# Patient Record
Sex: Female | Born: 1960 | Hispanic: No | Marital: Married | Smoking: Former smoker
Health system: Southern US, Community
[De-identification: ages and names within clinical notes are randomized; demographics above are authoritative.]

## PROBLEM LIST (undated history)

## (undated) DIAGNOSIS — T7840XA Allergy, unspecified, initial encounter: Secondary | ICD-10-CM

## (undated) DIAGNOSIS — D649 Anemia, unspecified: Secondary | ICD-10-CM

## (undated) DIAGNOSIS — K219 Gastro-esophageal reflux disease without esophagitis: Secondary | ICD-10-CM

## (undated) DIAGNOSIS — I10 Essential (primary) hypertension: Secondary | ICD-10-CM

## (undated) DIAGNOSIS — M1990 Unspecified osteoarthritis, unspecified site: Secondary | ICD-10-CM

## (undated) HISTORY — DX: Gastro-esophageal reflux disease without esophagitis: K21.9

## (undated) HISTORY — DX: Unspecified osteoarthritis, unspecified site: M19.90

## (undated) HISTORY — DX: Allergy, unspecified, initial encounter: T78.40XA

## (undated) HISTORY — PX: TUBAL LIGATION: SHX77

## (undated) HISTORY — DX: Essential (primary) hypertension: I10

## (undated) HISTORY — DX: Anemia, unspecified: D64.9

## (undated) HISTORY — PX: CHOLECYSTECTOMY: SHX55

---

## 2011-05-18 ENCOUNTER — Ambulatory Visit (INDEPENDENT_AMBULATORY_CARE_PROVIDER_SITE_OTHER): Payer: 59 | Admitting: Family Medicine

## 2011-05-18 VITALS — BP 148/84 | HR 68 | Temp 98.3°F | Resp 18 | Ht 64.0 in | Wt 261.2 lb

## 2011-05-18 DIAGNOSIS — K047 Periapical abscess without sinus: Secondary | ICD-10-CM

## 2011-05-18 DIAGNOSIS — E669 Obesity, unspecified: Secondary | ICD-10-CM

## 2011-05-18 DIAGNOSIS — K089 Disorder of teeth and supporting structures, unspecified: Secondary | ICD-10-CM

## 2011-05-18 DIAGNOSIS — I10 Essential (primary) hypertension: Secondary | ICD-10-CM

## 2011-05-18 DIAGNOSIS — K0889 Other specified disorders of teeth and supporting structures: Secondary | ICD-10-CM

## 2011-05-18 DIAGNOSIS — K029 Dental caries, unspecified: Secondary | ICD-10-CM

## 2011-05-18 MED ORDER — HYDROCODONE-ACETAMINOPHEN 5-500 MG PO TABS: 1.0000 | ORAL_TABLET | Freq: Three times a day (TID) | ORAL | Status: AC | PRN

## 2011-05-18 MED ORDER — CLINDAMYCIN HCL 300 MG PO CAPS: 300.0000 mg | ORAL_CAPSULE | Freq: Four times a day (QID) | ORAL | Status: AC

## 2011-05-18 NOTE — Progress Notes (Signed)
  Patient Name: Kim Lawson Date of Birth: 17-Dec-1960 Medical Record Number: [id number] Gender: female Date of Encounter: 05/18/2011  History of Present Illness:  Kim Lawson is a 51 y.o. very pleasant female patient who presents with the following:  Left toothache for about 3 days.  Has swollen and hurts a lot.  She has noted a temp to about 100.  This tooth has been a problem in the past- has broken off and needs repair.  She does not yet have a dentist but plans to call - she just got dental insurance.   No other symptoms to suggest a cold or illness.  She does note tender lymph nodes on the left as well.   She has a history of HTN and her PCP is Dr. Drue Stager in [place] but they are out today.   There is no problem list on file for this patient.  No past medical history on file. No past surgical history on file. History  Substance Use Topics  . Smoking status: Former Games developer  . Smokeless tobacco: Never Used  . Alcohol Use: No   No family history on file. Allergies  Allergen Reactions  . Penicillins Rash  . Septra (Bactrim) Rash    Medication list has been reviewed and updated.  Review of Systems: As per HPI- otherwise negative.   Physical Examination: Filed Vitals:   05/18/11 1353  BP: 148/84  Pulse: 68  Temp: 98.3 F (36.8 C)  TempSrc: Oral  Resp: 18  Height: 5\' 4"  (1.626 m)  Weight: 261 lb 3.2 oz (118.48 kg)    Body mass index is 44.84 kg/(m^2).   GEN: WDWN, NAD, Non-toxic, Alert & Oriented x 3, obese HEENT: Atraumatic, Normocephalic.  Ears and Nose: No external deformity.  TM wnl bilaterally, oropharynx wnl EXTR: No clubbing/cyanosis/edema NEURO: Normal gait.  PSYCH: Normally interactive. Conversant. Not depressed or anxious appearing.  Calm demeanor.  Left mandibular tooth # 21 or 22 is broken off at the base- surrounding area is swollen and tender, no fluctuance.  Left anterior chain nodes are slightly enlarged  Assessment and Plan: 1.  Toothache  HYDROcodone-acetaminophen (VICODIN) 5-500 MG per tablet  2. Dental infection  clindamycin (CLEOCIN) 300 MG capsule   Treat dental infection and pain as above. Let us know if we can help in any other way- she plans to get in touch with a dentist asap.

## 2012-03-28 ENCOUNTER — Ambulatory Visit (INDEPENDENT_AMBULATORY_CARE_PROVIDER_SITE_OTHER): Payer: 59 | Admitting: Internal Medicine

## 2012-03-28 VITALS — BP 132/88 | HR 102 | Temp 98.3°F | Resp 20 | Ht 65.0 in | Wt 248.0 lb

## 2012-03-28 DIAGNOSIS — L4050 Arthropathic psoriasis, unspecified: Secondary | ICD-10-CM | POA: Insufficient documentation

## 2012-03-28 DIAGNOSIS — R509 Fever, unspecified: Secondary | ICD-10-CM

## 2012-03-28 DIAGNOSIS — L409 Psoriasis, unspecified: Secondary | ICD-10-CM | POA: Insufficient documentation

## 2012-03-28 DIAGNOSIS — I10 Essential (primary) hypertension: Secondary | ICD-10-CM | POA: Insufficient documentation

## 2012-03-28 DIAGNOSIS — R1084 Generalized abdominal pain: Secondary | ICD-10-CM

## 2012-03-28 DIAGNOSIS — N3941 Urge incontinence: Secondary | ICD-10-CM | POA: Insufficient documentation

## 2012-03-28 DIAGNOSIS — R197 Diarrhea, unspecified: Secondary | ICD-10-CM

## 2012-03-28 LAB — POCT CBC
Granulocyte percent: 63.3 %G (ref 37–80)
HCT, POC: 45.1 % (ref 37.7–47.9)
Lymph, poc: 2.9 (ref 0.6–3.4)
MCHC: 31.9 g/dL (ref 31.8–35.4)
MCV: 94.2 fL (ref 80–97)
POC LYMPH PERCENT: 30.8 %L (ref 10–50)
RDW, POC: 15 %

## 2012-03-28 LAB — IFOBT (OCCULT BLOOD): IFOBT: NEGATIVE

## 2012-03-28 MED ORDER — DICYCLOMINE HCL 10 MG PO CAPS: 10.0000 mg | ORAL_CAPSULE | Freq: Three times a day (TID) | ORAL | Status: DC

## 2012-03-28 NOTE — Patient Instructions (Addendum)
Call Dr Rhetta Mura in the am for an appt. Try to eat the BRAT diet and drink to decrease dehydration. Collect stool samples.

## 2012-03-28 NOTE — Progress Notes (Signed)
[address] [place] [address]   Phone [phone]  Subjective:    Patient ID: Kim Lawson, female    DOB: 06/28/1960, 52 y.o.   MRN: [id number]  HPI  Pt presents to clinic with bloody diarrhea.  She started 8 days ago with a GI illness with diarrhea and vomiting and nausea that got better in a few days but on Thursday had got worse again.  She went to PCP who thought it was viral and suggested she use Imodium.  She was ok with that until today when she started to pass blood clots into the toilet.  She has had for the past 8 days - watery diarrhea at least 10 times per day, and today 4 stools with blood clots that sink to the bottom of the toilet - they have no stool in them.  She has not been able to eat in a week due to increase abd cramping after eating and then immediate diarrhea.  She is drinking but not much due also to abd cramping and instant diarrhea.  She has had fevers all week in to low 100s, Tmax 102.  She feels terrible, weak and dizzy.  She has not been on recent abx or travel. Enbrel is her newest medicine and it was started 3 months ago.  She has not been around anyone with similar symptoms.  She wore an adult diaper tonight due to the concern she would not be able to make it to the bathroom in time because when she gets the abd cramping the diarrhea is instant and explosive.  She has had a 10# weight loss. She has a GI MD Rhetta Mura) in [place] that she thinks that she can see tomorrow.   Review of Systems  Constitutional: Positive for fever and chills.  Gastrointestinal: Positive for nausea (resolved X 4 days.), vomiting (resolved x 4days), abdominal pain, diarrhea and blood in stool.  Neurological: Positive for dizziness.       Objective:   Physical Exam  Vitals reviewed. Constitutional: She is oriented to person, place, and time. She appears well-developed and well-nourished.  Pt is pale and appears that she does not feel good.  HENT:  Head: Normocephalic and  atraumatic.  Right Ear: External ear normal.  Left Ear: External ear normal.  Cardiovascular: Normal rate, regular rhythm and normal heart sounds.   Pulmonary/Chest: Effort normal and breath sounds normal.  Abdominal: Soft. She exhibits no mass. There is tenderness. There is no rebound and no guarding.  Genitourinary: Rectal exam shows no external hemorrhoid, no internal hemorrhoid (not palpable), no fissure, no tenderness and anal tone normal. Guaiac negative stool (blood on glove after exam).  Musculoskeletal: Normal range of motion.  Neurological: She is alert and oriented to person, place, and time.  Skin: Skin is warm and dry.  Psychiatric: She has a normal mood and affect. Her behavior is normal. Judgment and thought content normal.    Results for orders placed in visit on 03/28/12  IFOBT (OCCULT BLOOD)      Result Value Range   IFOBT Negative    POCT CBC      Result Value Range   WBC 9.5  4.6 - 10.2 K/uL   Lymph, poc 2.9  0.6 - 3.4   POC LYMPH PERCENT 30.8  10 - 50 %L   MID (cbc) 0.6  0 - 0.9   POC MID % 5.9  0 - 12 %M   POC Granulocyte 6.0  2 - 6.9  Granulocyte percent 63.3  37 - 80 %G   RBC 4.79  4.04 - 5.48 M/uL   Hemoglobin 14.4  12.2 - 16.2 g/dL   HCT, POC 16.1  09.6 - 47.9 %   MCV 94.2  80 - 97 fL   MCH, POC 30.1  27 - 31.2 pg   MCHC 31.9  31.8 - 35.4 g/dL   RDW, POC 04.5     Platelet Count, POC 296  142 - 424 K/uL   MPV 9.6  0 - 99.8 fL       Assessment & Plan:   1. Bloody diarrhea  IFOBT POC (occult bld, rslt in office)   IFOBT POC (occult bld, rslt in office)   Giardia/cryptosporidium (EIA)   Ova and parasite examination   Fecal lactoferrin   Stool culture   Clostridium difficile toxin   Sedimentation Rate   Comprehensive metabolic panel   CANCELED: POCT SEDIMENTATION RATE  2. Fever, unspecified  POCT CBC   POCT CBC  3. Generalized abdominal cramping  dicyclomine (BENTYL) 10 MG capsule   dicyclomine (BENTYL) 10 MG capsule   Pt to call her GI in  the am for an appt.  The most likely caused of her bloody diarrhea is an internal hemorrhoid resulting from 8 days of diarrhea.  Her stool is probably partially still liquid due to no solid food in 8 days.  Pt in not orthostatic even on BP meds but she is dehydrated due to her elevated pulse and she is going to try and increase her fluid intake and hopefully the bentyl will allow her to drink without having cramps and instant diarrhea.  She should watch her motrin intake.  She will collect her stool cultures and take them to her appt with her GI if it is tomorrow, if she has trouble getting an appt she will let me know.   D/w Dr Merla Riches. I have reviewed and agree with documentation. Robert P. Merla Riches, M.D.

## 2012-03-29 ENCOUNTER — Telehealth: Payer: Self-pay | Admitting: Physician Assistant

## 2012-03-29 LAB — COMPREHENSIVE METABOLIC PANEL
ALT: 60 U/L — ABNORMAL HIGH (ref 0–35)
AST: 55 U/L — ABNORMAL HIGH (ref 0–37)
Albumin: 4 g/dL (ref 3.5–5.2)
Alkaline Phosphatase: 71 U/L (ref 39–117)
BUN: 9 mg/dL (ref 6–23)
Calcium: 9.6 mg/dL (ref 8.4–10.5)
Chloride: 103 mEq/L (ref 96–112)
Creat: 0.8 mg/dL (ref 0.50–1.10)
Total Bilirubin: 0.5 mg/dL (ref 0.3–1.2)
Total Protein: 7 g/dL (ref 6.0–8.3)

## 2012-03-29 LAB — SEDIMENTATION RATE: Sed Rate: 10 mm/hr (ref 0–22)

## 2012-03-29 NOTE — Telephone Encounter (Signed)
I was just calling to check on patients status and make sure that she got into GI today.  Her SED rate was normal. Her liver enzymes are slightly elevated.  They should be rechecked by someone at some point.

## 2012-03-31 LAB — FECAL LACTOFERRIN, QUANT: Lactoferrin: POSITIVE

## 2012-04-01 LAB — GIARDIA/CRYPTOSPORIDIUM (EIA): Cryptosporidium Screen (EIA): NEGATIVE

## 2012-04-02 LAB — STOOL CULTURE

## 2012-04-04 LAB — OVA AND PARASITE EXAMINATION: OP: NONE SEEN

## 2012-04-18 LAB — CLOSTRIDIUM DIFFICILE CULTURE-FECAL

## 2012-04-18 NOTE — Telephone Encounter (Signed)
C Diff results finally returned normal.  I called and LMOM to check her status and to have her call back if there were any further concerns

## 2014-06-10 ENCOUNTER — Ambulatory Visit (INDEPENDENT_AMBULATORY_CARE_PROVIDER_SITE_OTHER): Payer: Managed Care, Other (non HMO) | Admitting: Family Medicine

## 2014-06-10 VITALS — BP 148/68 | HR 90 | Temp 98.3°F | Resp 20 | Ht 64.0 in | Wt 259.2 lb

## 2014-06-10 DIAGNOSIS — K088 Other specified disorders of teeth and supporting structures: Secondary | ICD-10-CM | POA: Diagnosis not present

## 2014-06-10 DIAGNOSIS — H9201 Otalgia, right ear: Secondary | ICD-10-CM

## 2014-06-10 DIAGNOSIS — K120 Recurrent oral aphthae: Secondary | ICD-10-CM | POA: Diagnosis not present

## 2014-06-10 DIAGNOSIS — R6884 Jaw pain: Secondary | ICD-10-CM | POA: Diagnosis not present

## 2014-06-10 DIAGNOSIS — K0889 Other specified disorders of teeth and supporting structures: Secondary | ICD-10-CM

## 2014-06-10 DIAGNOSIS — Z79899 Other long term (current) drug therapy: Secondary | ICD-10-CM

## 2014-06-10 MED ORDER — CLINDAMYCIN HCL 300 MG PO CAPS: 300.0000 mg | ORAL_CAPSULE | Freq: Three times a day (TID) | ORAL | Status: DC

## 2014-06-10 MED ORDER — VALACYCLOVIR HCL 1 G PO TABS: 1000.0000 mg | ORAL_TABLET | Freq: Three times a day (TID) | ORAL | Status: DC

## 2014-06-10 MED ORDER — TRIAMCINOLONE ACETONIDE 0.1 % MT PSTE: 1.0000 "application " | PASTE | Freq: Two times a day (BID) | OROMUCOSAL | Status: AC

## 2014-06-10 MED ORDER — TRAMADOL HCL 50 MG PO TABS: 50.0000 mg | ORAL_TABLET | Freq: Four times a day (QID) | ORAL | Status: DC | PRN

## 2014-06-10 NOTE — Progress Notes (Signed)
Subjective:    Patient ID: Kim Lawson, female    DOB: August 07, 1960, 54 y.o.   MRN: [id number]  HPI Kim Lawson is a 54 y.o. female  C/o sore on side of tongue that started with some on front of tongue about 8 day ago. Front of tongue had blisters that resolved after about 3 days, but lesion on side of tongue has persisted.  Feels more painful, sore to eat and drink. Has ad mouth sores about 2-3 times per year, but usually resolve on own.   Tx: oragel, ibuprofen.   Now with pain into R ear and R jaw since 2 nights ago. Feels swollen under R jaw. Hearing normally out of ear, no tinnitus.  Sharp pain into R ear. No rash on face now or initially.   Takes Enbrel for psoriatic arthritis/psoriasis.    Patient Active Problem List   Diagnosis Date Noted  . Psoriatic arthritis 03/28/2012  . Psoriasis 03/28/2012  . Urge incontinence 03/28/2012  . HTN (hypertension) 03/28/2012  . Hypertension 05/18/2011  . Obesity 05/18/2011   Past Medical History  Diagnosis Date  . Allergy   . Anemia   . Arthritis   . GERD (gastroesophageal reflux disease)   . Hypertension    Past Surgical History  Procedure Laterality Date  . Cholecystectomy    . Tubal ligation     Allergies  Allergen Reactions  . Penicillins Rash  . Septra [Bactrim] Rash  . Sulfa Antibiotics Rash  . Latex   . Norco [Hydrocodone-Acetaminophen] Rash   Prior to Admission medications   Medication Sig Start Date End Date Taking? Authorizing Provider  aspirin 325 MG EC tablet Take 325 mg by mouth daily.   Yes Historical Provider, MD  dicyclomine (BENTYL) 10 MG capsule Take 1-2 capsules (10-20 mg total) by mouth 4 (four) times daily -  before meals and at bedtime. 03/28/12  Yes Morrell RiddleSarah L Weber, PA-C  etanercept (ENBREL) 50 MG/ML injection Inject 50 mg into the skin. Bi-weekly   Yes Historical Provider, MD  lisinopril-hydrochlorothiazide (PRINZIDE,ZESTORETIC) 20-25 MG per tablet Take 1 tablet by mouth daily.   Yes Historical  Provider, MD  metoprolol succinate (TOPROL-XL) 50 MG 24 hr tablet Take 50 mg by mouth daily. Take with or immediately following a meal.   Yes Historical Provider, MD  solifenacin (VESICARE) 10 MG tablet Take 5 mg by mouth daily.   Yes Historical Provider, MD   History   Social History  . Marital Status: Married    Spouse Name: N/A  . Number of Children: N/A  . Years of Education: N/A   Occupational History  . RN    Social History Main Topics  . Smoking status: Former Games developermoker  . Smokeless tobacco: Never Used  . Alcohol Use: No  . Drug Use: No  . Sexual Activity: Not on file   Other Topics Concern  . Not on file   Social History Narrative       Review of Systems  Constitutional: Positive for chills. Negative for fever.  HENT: Positive for congestion, ear pain and sore throat (r side. ). Negative for ear discharge, facial swelling and trouble swallowing.   Respiratory: Negative for cough and shortness of breath.   Skin: Negative for rash.  Neurological: Negative for dizziness, seizures, syncope, speech difficulty, weakness, light-headedness and headaches.       Objective:   Physical Exam  Constitutional: She is oriented to person, place, and time. She appears well-developed and well-nourished. No distress.  HENT:  Head: Normocephalic and atraumatic.    Right Ear: Hearing, tympanic membrane, external ear and ear canal normal. Tympanic membrane is not erythematous, not retracted and not bulging.  Left Ear: Hearing, tympanic membrane, external ear and ear canal normal.  Nose: Nose normal. Right sinus exhibits no maxillary sinus tenderness and no frontal sinus tenderness. Left sinus exhibits no maxillary sinus tenderness and no frontal sinus tenderness.  Mouth/Throat: Uvula is midline, oropharynx is clear and moist and mucous membranes are normal. Oral lesions present. No oropharyngeal exudate.    Eyes: Conjunctivae and EOM are normal. Pupils are equal, round, and  reactive to light.  Neck: Trachea normal. Neck supple.    Cardiovascular: Normal rate, regular rhythm, normal heart sounds and intact distal pulses.   No murmur heard. Pulmonary/Chest: Effort normal and breath sounds normal. No respiratory distress. She has no wheezes. She has no rhonchi.  Lymphadenopathy:    She has no cervical adenopathy.  Neurological: She is alert and oriented to person, place, and time.  Skin: Skin is warm and dry. No rash noted.  Psychiatric: She has a normal mood and affect. Her behavior is normal.  Vitals reviewed.  Filed Vitals:   06/10/14 1228  BP: 148/68  Pulse: 90  Temp: 98.3 F (36.8 C)  TempSrc: Oral  Resp: 20  Height:  (1.626 m)  Weight: 259 lb 4 oz (117.595 kg)  SpO2: 97%          Assessment & Plan:   Kim Lawson is a 54 y.o. 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 This may be causing pain radiation to jaw/ear, but ddx also includes early zoster. No rash seen, including in ear canal, bu with underlying immune suppression with use of Enbrel, possible increased risk of Zoster.   -valtrex 1gram TID to cover for HSV/zoster.   -Clindamycin for possible odontogenic infection with PCN allergy. SED, Cdiff risks/sx's advised.   -plan for her to call dentist for eval in 2 days.   -kenalog in orabase to apthous ulcer.   -tramadol if needed for pain - SED.   -rtc precautions.   Meds ordered this encounter  Medications  . triamcinolone (KENALOG) 0.1 % paste      Sig: Use as directed 1 application in the mouth or throat 2 (two) times daily. To ulcer on tongue only.    Dispense:  5 g    Refill:  0  . valACYclovir (VALTREX) 1000 MG tablet    Sig: Take 1 tablet (1,000 mg total) by mouth 3 (three) times daily.    Dispense:  21 tablet    Refill:  0  . clindamycin (CLEOCIN) 300 MG capsule    Sig: Take 1 capsule (300 mg total) by mouth 3 (three) times daily.    Dispense:  30 capsule    Refill:  0  . traMADol (ULTRAM) 50 MG tablet    Sig: Take 1 tablet (50 mg total) by mouth every 6 (six) hours as needed.    Dispense:  30 tablet    Refill:  0   Patient Instructions  Call you dentist tomorrow as some of pain may be coming from the lower right tooth - this can radiate to jaw.  In case there is an infection around that tooth, I started clindamycin.   I also prescribed valtrex in case the pain in your face is due to the shingles virus or cold sore virus, as with your usual medication - your immune system may be susceptible.   If pain is not improving in next few days, or worsening sooner (including any fever, or rash) - return here or emergency room.   Return to the clinic or go to the nearest emergency room if any of your symptoms worsen or new symptoms occur.

## 2014-06-10 NOTE — Patient Instructions (Signed)
Call you dentist tomorrow as some of pain may be coming from the lower right tooth - this can radiate to jaw.  In case there is an infection around that tooth, I started clindamycin.   I also prescribed valtrex in case the pain in your face is due to the shingles virus or cold sore virus, as with your usual medication - your immune system may be susceptible.   If pain is not improving in next few days, or worsening sooner (including any fever, or rash) - return here or emergency room.   Return to the clinic or go to the nearest emergency room if any of your symptoms worsen or new symptoms occur.

## 2014-08-15 ENCOUNTER — Ambulatory Visit (INDEPENDENT_AMBULATORY_CARE_PROVIDER_SITE_OTHER): Payer: Managed Care, Other (non HMO) | Admitting: Family Medicine

## 2014-08-15 ENCOUNTER — Ambulatory Visit (INDEPENDENT_AMBULATORY_CARE_PROVIDER_SITE_OTHER): Payer: Managed Care, Other (non HMO)

## 2014-08-15 VITALS — BP 122/78 | HR 92 | Temp 98.1°F | Resp 18 | Ht 64.5 in | Wt 259.0 lb

## 2014-08-15 DIAGNOSIS — S90122A Contusion of left lesser toe(s) without damage to nail, initial encounter: Secondary | ICD-10-CM

## 2014-08-15 MED ORDER — TRAMADOL HCL 50 MG PO TABS: 50.0000 mg | ORAL_TABLET | Freq: Four times a day (QID) | ORAL | Status: DC | PRN

## 2014-08-15 NOTE — Progress Notes (Signed)
[facility] [address], [place] [id number] [phone]  Date:  08/15/2014   Name:  Kim ApleyLaura Lawson   DOB:  24-Sep-1960   MRN:  [id number]  PCP:  No primary care provider on file.    Chief Complaint: Foot Injury   History of Present Illness:  Kim ApleyLaura Lawson is a 54 y.o. very pleasant female patient who presents with the following:  She hit her left foot against the lawnmower a few days ago- hurt her pinky toe.  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  She was on her feet all night last night- this seems to have exeraberated   Patient Active Problem List   Diagnosis Date Noted  . Psoriatic arthritis 03/28/2012  . Psoriasis 03/28/2012  . Urge incontinence 03/28/2012  . HTN (hypertension) 03/28/2012  . Hypertension 05/18/2011  . Obesity 05/18/2011    Past Medical History  Diagnosis Date  . Allergy   . Anemia   . Arthritis   . GERD (gastroesophageal reflux disease)   . Hypertension     Past Surgical History  Procedure Laterality Date  . Cholecystectomy    . Tubal ligation      History  Substance Use Topics  . Smoking status: Former Games developermoker  . Smokeless tobacco: Never Used  . Alcohol Use: No    Family History  Problem Relation Age of Onset  . Cancer Mother   . Heart disease Mother   . Heart disease Father   . Cancer Father   . Epilepsy Brother   . Asthma Son   . Heart attack Maternal Grandmother   . Cancer Maternal Grandfather   . Stroke Paternal Grandmother   . Cancer Paternal Grandfather   . Asthma Son     Allergies  Allergen Reactions  . Penicillins Rash  . Septra [Bactrim] Rash  . Sulfa Antibiotics Rash  . Latex   . Norco [Hydrocodone-Acetaminophen] Rash    Medication list has been reviewed and updated.  Current Outpatient Prescriptions on File Prior to Visit  Medication Sig Dispense Refill  . aspirin 325 MG EC tablet Take 325 mg by mouth daily.    Marland Kitchen. etanercept (ENBREL) 50 MG/ML  injection Inject 50 mg into the skin. Bi-weekly    . lisinopril-hydrochlorothiazide (PRINZIDE,ZESTORETIC) 20-25 MG per tablet Take 1 tablet by mouth daily.    . metoprolol succinate (TOPROL-XL) 50 MG 24 hr tablet Take 50 mg by mouth daily. Take with or immediately following a meal.    . triamcinolone (KENALOG) 0.1 % paste Use as directed 1 application in the mouth or throat 2 (two) times daily. To ulcer on tongue only. 5 g 0  . clindamycin (CLEOCIN) 300 MG capsule Take 1 capsule (300 mg total) by mouth 3 (three) times daily. (Patient not taking: Reported on 08/15/2014) 30 capsule 0  . dicyclomine (BENTYL) 10 MG capsule Take 1-2 capsules (10-20 mg total) by mouth 4 (four) times daily -  before meals and at bedtime. (Patient not taking: Reported on 08/15/2014) 40 capsule 0  . solifenacin (VESICARE) 10 MG tablet Take 5 mg by mouth daily.    . traMADol (ULTRAM) 50 MG tablet Take 1 tablet (50 mg total) by mouth every 6 (six) hours as needed. (Patient not taking: Reported on 08/15/2014) 30 tablet 0  . valACYclovir (VALTREX) 1000 MG tablet Take 1 tablet (1,000 mg total) by mouth 3 (three) times daily. (Patient not taking: Reported on 08/15/2014)  21 tablet 0   No current facility-administered medications on file prior to visit.    Review of Systems:  As per HPI- otherwise negative.   Physical Examination: Filed Vitals:   08/15/14 1339  BP: 122/78  Pulse: 92  Temp: 98.1 F (36.7 C)  Resp: 18   Filed Vitals:   08/15/14 1339  Height: 5' 4.5" (1.638 m)  Weight: 259 lb (117.482 kg)   Body mass index is 43.79 kg/(m^2). Ideal Body Weight: Weight in (lb) to have BMI = 25: 147.6  GEN: WDWN, NAD, Non-toxic, A & O x 3 HEENT: Atraumatic, Normocephalic. Neck supple. No masses, No LAD. Ears and Nose: No external deformity. CV: RRR, No M/G/R. No JVD. No thrill. No extra heart sounds. PULM: CTA B, no wheezes, crackles, rhonchi. No retractions. No resp. distress. No accessory muscle use. EXTR: No  c/c/e NEURO Normal gait.  PSYCH: Normally interactive. Conversant. Not depressed or anxious appearing.  Calm demeanor.  Left foot: tenderness, bruise at the base of the small toe.  Tenderness over the small toe and at the distal 4th MT as well. The rest of her foot is non- tender, ankle negative Foot NV intact   [facility] reading (PRIMARY) by  Dr. Patsy Lager. Left foot: non- displaced fracture at proximal phalanx of 5th toe, proximal aspect   LEFT FOOT - COMPLETE 3+ VIEW  COMPARISON: None  FINDINGS: The bones of the fifth toe are osteopenic. No acute fracture nor dislocation is observed. There is a radiopaque foreign body that projects in the soft tissues over the medial aspect of the distal portion of the toe.  The other phalanges are intact. The metatarsals are intact. The bones of the hindfoot exhibit no acute abnormalities. There is a large plantar calcaneal spur.  IMPRESSION: There is no acute bony abnormality of the fifth toe. There is a approximately 1 mm radiodense foreign body in the soft tissues of the medial aspect of the fifth toe.  Toe buddy taped to 4th toe and placed in a post- op shoe; this felt good to her Pt endorses that she can take tramadol wihtout ill effect  Assessment and Plan: Contusion, toe, left, initial encounter - Plan: DG Foot Complete Left, traMADol (ULTRAM) 50 MG tablet  Foot contusion, possible toe fracture.  Buddy taped and placed in post- op shoe.  Refilled tramadol for her to use as needed for pain Note to be OOW for 2 days Called when OR report received as above- left detailed message about possible metallic FB.  This is likely old but will check in with her again later  Signed Abbe Amsterdam, MD

## 2014-08-15 NOTE — Patient Instructions (Signed)
Keep your foot elevated when you can, and do not go to work for the next couple of days Use the tramadol as needed for pain Let me know if you do not have improved pain soon! Wear the stiff shoe and buddy tape to the 4th toe as needed

## 2014-08-16 ENCOUNTER — Telehealth: Payer: Self-pay | Admitting: Family Medicine

## 2014-08-18 NOTE — Telephone Encounter (Signed)
Called again- no answer.  LMOM that I will send her a message of her x-ray report- if any questions or persistent pain please call

## 2015-12-14 IMAGING — CR DG FOOT COMPLETE 3+V*L*
3 series · 3 of 3 positions shown · non-contrast
Comparison: None

CLINICAL DATA: Low are mobile or injury of the left fifth toe with
increasing pain and visible bruising

EXAM:
LEFT FOOT - COMPLETE 3+ VIEW

[AP]
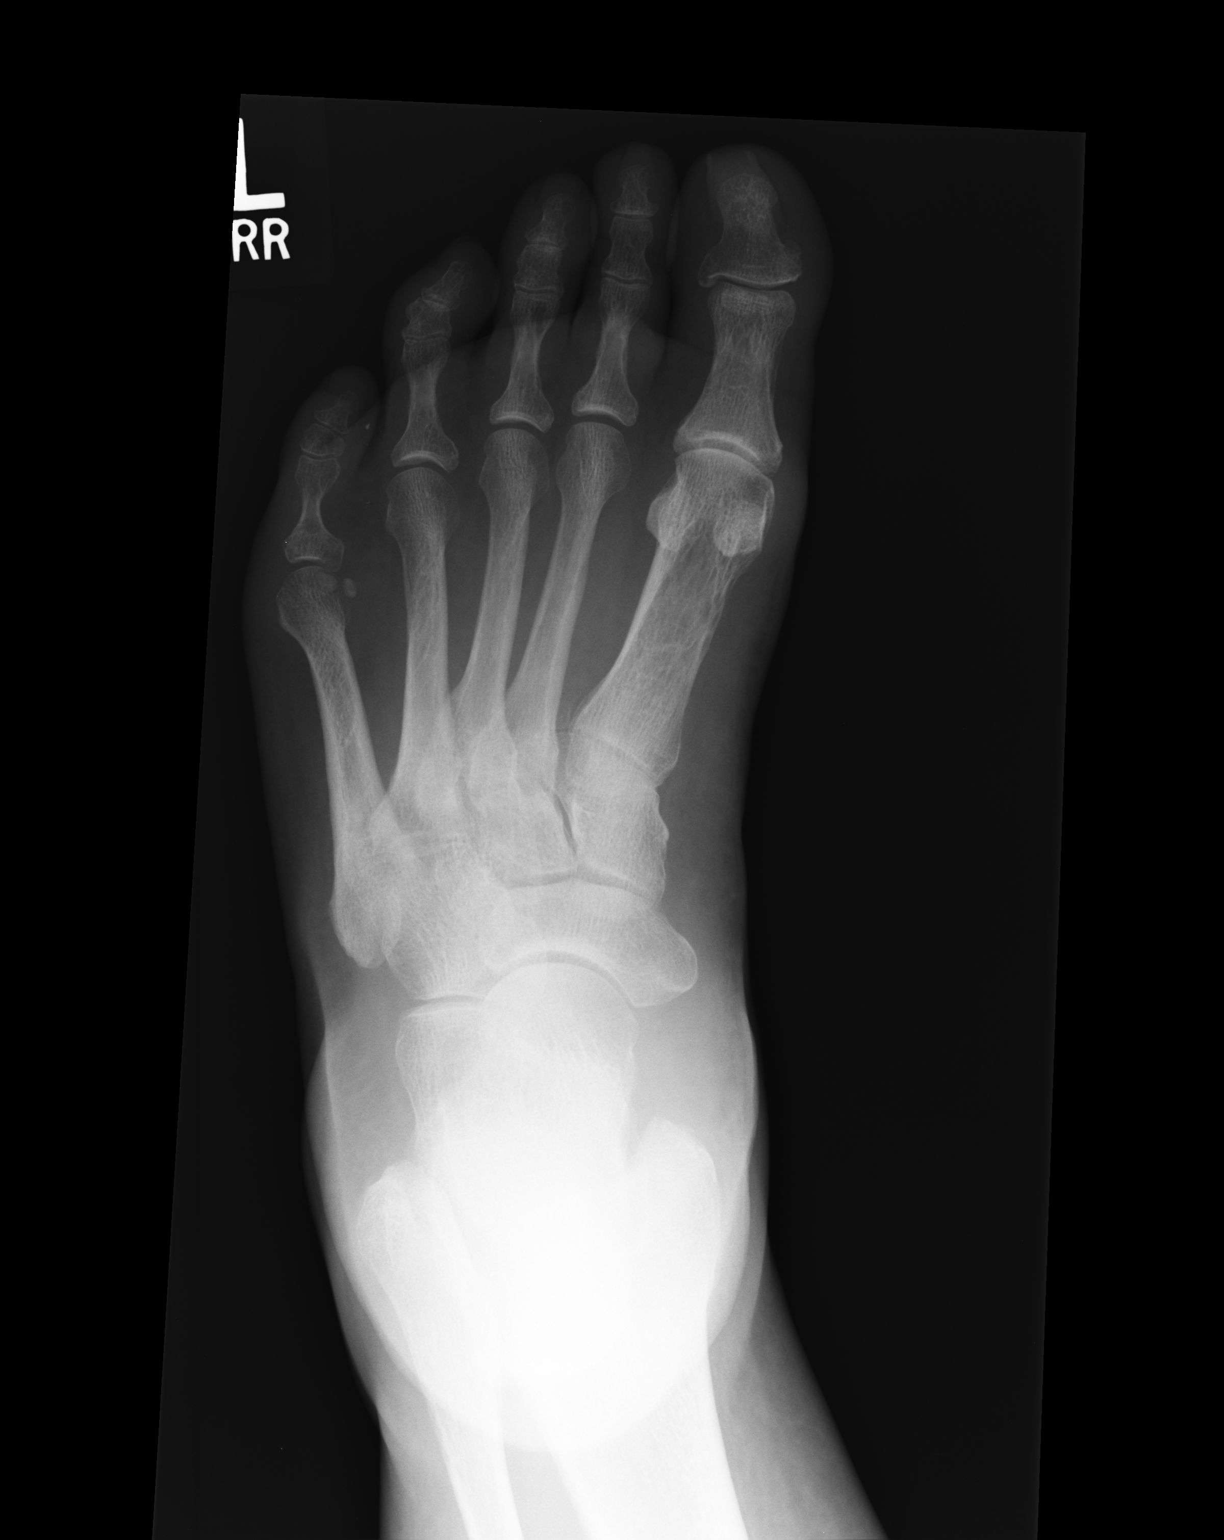

[ap obl int rot]
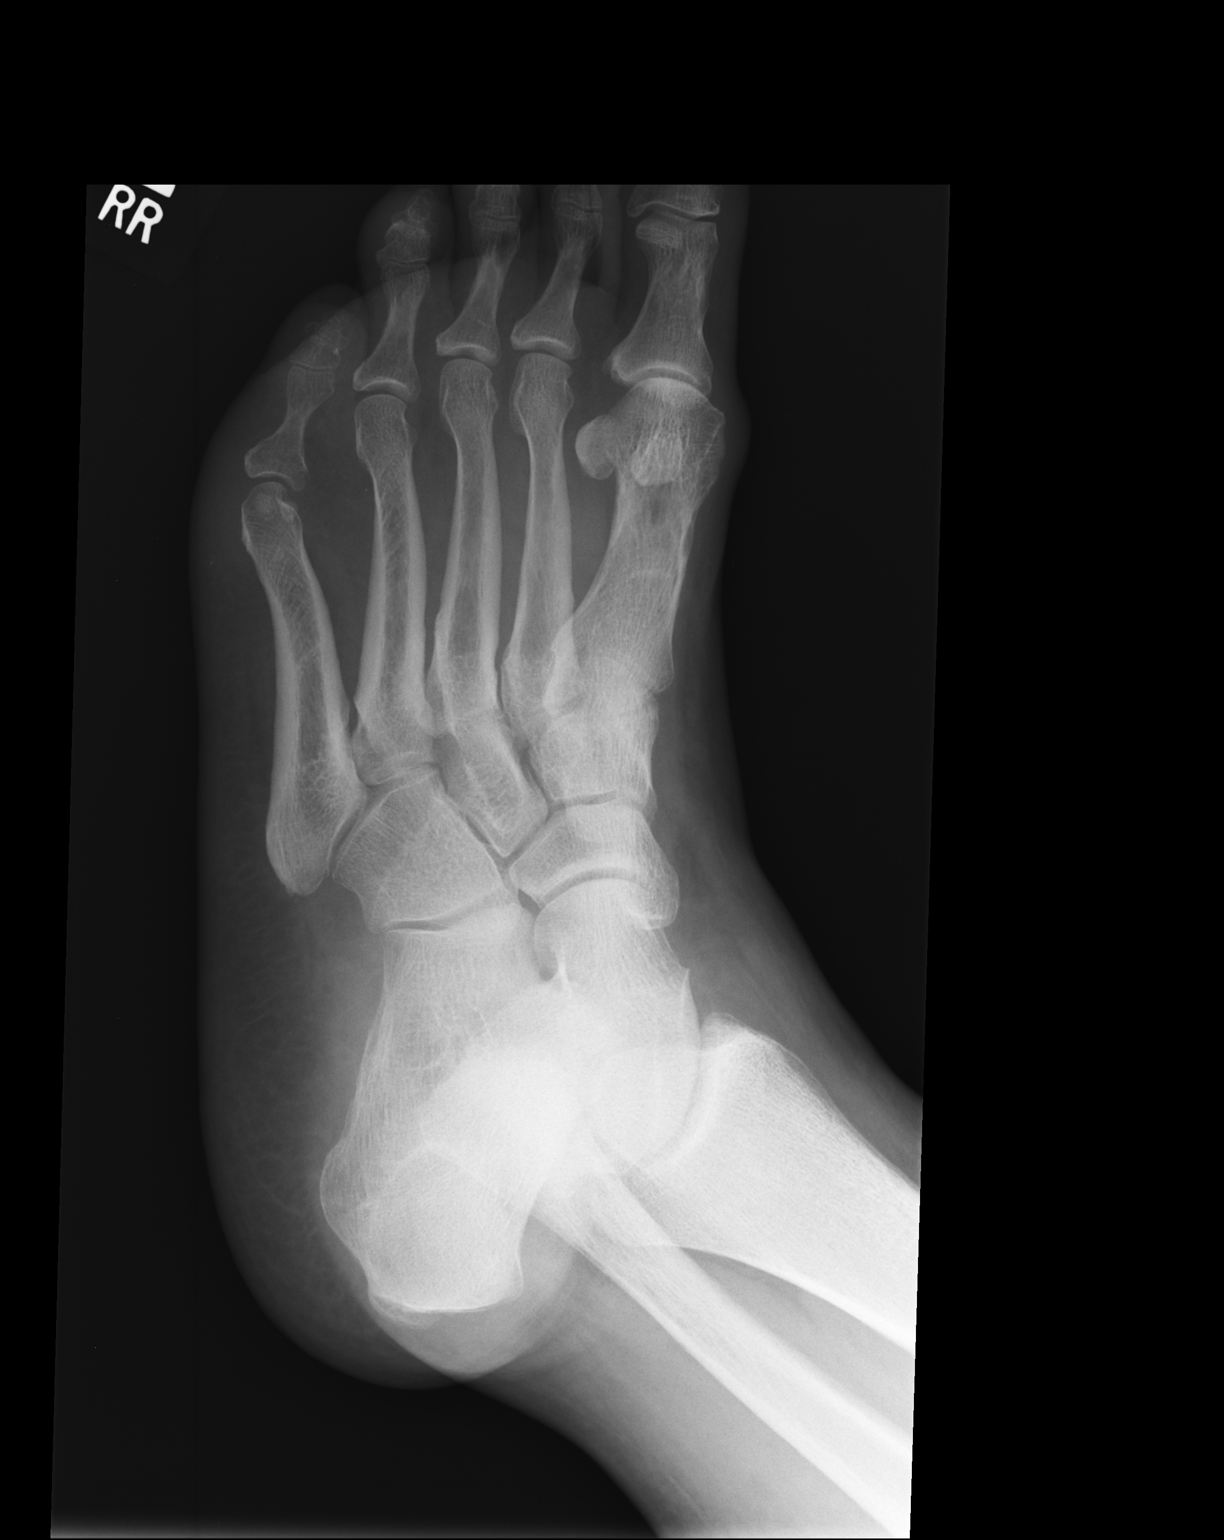

[lateral]
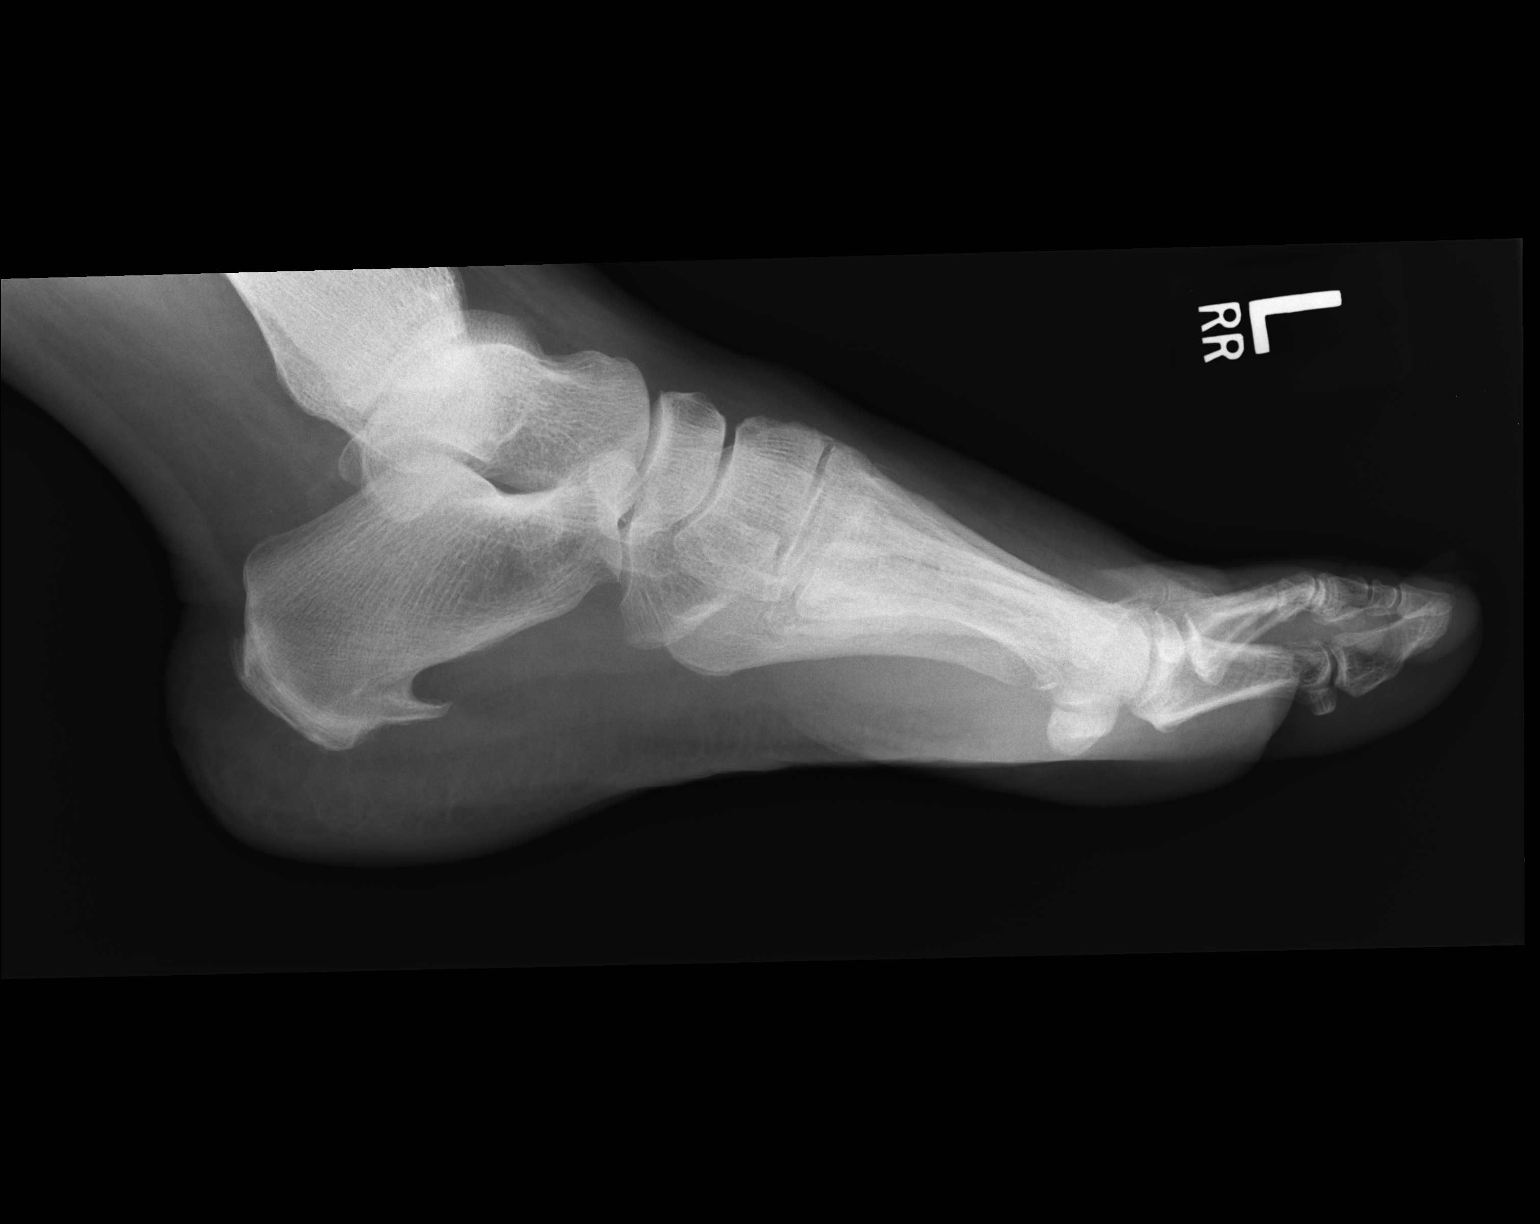

[3 of 3 positions shown; findings below may reference images not displayed]

FINDINGS: The bones of the fifth toe are osteopenic. No acute fracture nor
dislocation is observed. There is a radiopaque foreign body that
projects in the soft tissues over the medial aspect of the distal
portion of the toe.

The other phalanges are intact. The metatarsals are intact. The
bones of the hindfoot exhibit no acute abnormalities. There is a
large plantar calcaneal spur.
IMPRESSION: There is no acute bony abnormality of the fifth toe. There is a
approximately 1 mm radiodense foreign body in the soft tissues of
the medial aspect of the fifth toe.

## 2018-08-10 ENCOUNTER — Encounter: Payer: Self-pay | Admitting: Podiatry

## 2018-08-10 ENCOUNTER — Other Ambulatory Visit: Payer: Self-pay

## 2018-08-10 ENCOUNTER — Ambulatory Visit (INDEPENDENT_AMBULATORY_CARE_PROVIDER_SITE_OTHER): Payer: BC Managed Care – PPO | Admitting: Podiatry

## 2018-08-10 DIAGNOSIS — M79675 Pain in left toe(s): Secondary | ICD-10-CM | POA: Diagnosis not present

## 2018-08-10 DIAGNOSIS — M79674 Pain in right toe(s): Secondary | ICD-10-CM | POA: Diagnosis not present

## 2018-08-10 DIAGNOSIS — E1140 Type 2 diabetes mellitus with diabetic neuropathy, unspecified: Secondary | ICD-10-CM | POA: Insufficient documentation

## 2018-08-10 DIAGNOSIS — E1142 Type 2 diabetes mellitus with diabetic polyneuropathy: Secondary | ICD-10-CM | POA: Diagnosis not present

## 2018-08-10 DIAGNOSIS — B351 Tinea unguium: Secondary | ICD-10-CM

## 2018-08-10 NOTE — Progress Notes (Signed)
This patient presents to the office with chief complaint of long thick nails and diabetic feet.  This patient  says there  is  no pain and discomfort in her  feet.  This patient says there are long thick painful nails.  These nails are painful walking and wearing shoes.  Patient has no history of infection or drainage from both feet.  Patient is unable to  self treat his own nails . This patient presents  to the office today for treatment of the  long nails and a foot evaluation due to history of  Diabetes. Patient says she has been diagnosed with diabetic neuropathy and is taking gabapentin.  General Appearance  Alert, conversant and in no acute stress.  Vascular  Dorsalis pedis and posterior tibial  pulses are palpable  bilaterally.  Capillary return is within normal limits  bilaterally. Temperature is within normal limits  bilaterally.  Neurologic  Senn-Weinstein monofilament wire test within normal limits  Right.  LOPS absent left foot.. Muscle power within normal limits bilaterally.  Nails Thick disfigured discolored nails with subungual debris  from hallux to fifth toes bilaterally. No evidence of bacterial infection or drainage bilaterally.  Orthopedic  No limitations of motion of motion feet .  No crepitus or effusions noted.  No bony pathology or digital deformities noted.  HAV  B/L.  Skin  normotropic skin with no porokeratosis noted bilaterally.  No signs of infections or ulcers noted.     Onychomycosis  Diabetes with no foot complications  IE  Debride nails x 10.  A diabetic foot exam was performed and there is no evidence of any vascular or neurologic pathology.   RTC 3 months.   Gardiner Barefoot DPM

## 2018-08-18 ENCOUNTER — Other Ambulatory Visit: Payer: BC Managed Care – PPO | Admitting: Orthotics

## 2018-11-16 ENCOUNTER — Ambulatory Visit: Payer: BC Managed Care – PPO | Admitting: Podiatry
